# Patient Record
Sex: Female | Born: 2005 | Race: Black or African American | Hispanic: No | Marital: Single | Smoking: Never smoker
Health system: Southern US, Community
[De-identification: ages and names within clinical notes are randomized; demographics above are authoritative.]

## PROBLEM LIST (undated history)

## (undated) ENCOUNTER — Ambulatory Visit (HOSPITAL_COMMUNITY)

## (undated) DIAGNOSIS — T7432XA Child psychological abuse, confirmed, initial encounter: Secondary | ICD-10-CM

## (undated) DIAGNOSIS — L2089 Other atopic dermatitis: Secondary | ICD-10-CM

## (undated) DIAGNOSIS — T7412XA Child physical abuse, confirmed, initial encounter: Secondary | ICD-10-CM

## (undated) DIAGNOSIS — Z559 Problems related to education and literacy, unspecified: Secondary | ICD-10-CM

## (undated) DIAGNOSIS — J189 Pneumonia, unspecified organism: Secondary | ICD-10-CM

## (undated) DIAGNOSIS — J4520 Mild intermittent asthma, uncomplicated: Secondary | ICD-10-CM

## (undated) DIAGNOSIS — J309 Allergic rhinitis, unspecified: Secondary | ICD-10-CM

## (undated) DIAGNOSIS — R6250 Unspecified lack of expected normal physiological development in childhood: Secondary | ICD-10-CM

## (undated) DIAGNOSIS — L851 Acquired keratosis [keratoderma] palmaris et plantaris: Secondary | ICD-10-CM

## (undated) DIAGNOSIS — S62617A Displaced fracture of proximal phalanx of left little finger, initial encounter for closed fracture: Secondary | ICD-10-CM

## (undated) HISTORY — DX: Mild intermittent asthma, uncomplicated: J45.20

## (undated) HISTORY — PX: OTHER SURGICAL HISTORY: SHX169

## (undated) HISTORY — DX: Child psychological abuse, confirmed, initial encounter: T74.32XA

## (undated) HISTORY — DX: Acquired keratosis (keratoderma) palmaris et plantaris: L85.1

## (undated) HISTORY — DX: Displaced fracture of proximal phalanx of left little finger, initial encounter for closed fracture: S62.617A

## (undated) HISTORY — DX: Child physical abuse, confirmed, initial encounter: T74.12XA

## (undated) HISTORY — DX: Problems related to education and literacy, unspecified: Z55.9

## (undated) HISTORY — DX: Other atopic dermatitis: L20.89

## (undated) HISTORY — DX: Pneumonia, unspecified organism: J18.9

## (undated) HISTORY — DX: Unspecified lack of expected normal physiological development in childhood: R62.50

## (undated) HISTORY — DX: Allergic rhinitis, unspecified: J30.9

---
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
## 2024-05-10 ENCOUNTER — Encounter (HOSPITAL_COMMUNITY): Payer: Self-pay

## 2024-05-10 ENCOUNTER — Ambulatory Visit (HOSPITAL_COMMUNITY)
Admission: EM | Admit: 2024-05-10 | Discharge: 2024-05-10 | Disposition: A | Discharge status: Home / Self Care | Attending: Nurse Practitioner | Admitting: Nurse Practitioner

## 2024-05-10 ENCOUNTER — Ambulatory Visit (HOSPITAL_COMMUNITY): Payer: Self-pay | Admitting: Nurse Practitioner

## 2024-05-10 ENCOUNTER — Other Ambulatory Visit: Payer: Self-pay

## 2024-05-10 ENCOUNTER — Emergency Department (HOSPITAL_COMMUNITY)
Admission: EM | Admit: 2024-05-10 | Discharge: 2024-05-11 | Disposition: A | Discharge status: Home / Self Care | Attending: Emergency Medicine | Admitting: Emergency Medicine

## 2024-05-10 VITALS — BP 106/70 | HR 80 | Temp 99.1°F | Resp 15

## 2024-05-10 DIAGNOSIS — R55 Syncope and collapse: Secondary | ICD-10-CM | POA: Insufficient documentation

## 2024-05-10 DIAGNOSIS — E860 Dehydration: Secondary | ICD-10-CM | POA: Diagnosis not present

## 2024-05-10 DIAGNOSIS — D649 Anemia, unspecified: Secondary | ICD-10-CM | POA: Diagnosis not present

## 2024-05-10 DIAGNOSIS — Z3202 Encounter for pregnancy test, result negative: Secondary | ICD-10-CM | POA: Diagnosis not present

## 2024-05-10 DIAGNOSIS — R42 Dizziness and giddiness: Secondary | ICD-10-CM | POA: Diagnosis present

## 2024-05-10 LAB — CBC WITH DIFFERENTIAL/PLATELET
Abs Immature Granulocytes: 0.02 K/uL (ref 0.00–0.07)
Abs Immature Granulocytes: 0.02 K/uL (ref 0.00–0.07)
Basophils Absolute: 0.1 K/uL (ref 0.0–0.1)
Basophils Absolute: 0.1 K/uL (ref 0.0–0.1)
Basophils Relative: 1 %
Basophils Relative: 1 %
Eosinophils Absolute: 0.1 K/uL (ref 0.0–0.5)
Eosinophils Absolute: 0.1 K/uL (ref 0.0–0.5)
Eosinophils Relative: 1 %
Eosinophils Relative: 1 %
HCT: 25.1 % — ABNORMAL LOW (ref 36.0–46.0)
HCT: 24 % — ABNORMAL LOW (ref 36.0–46.0)
Hemoglobin: 7.4 g/dL — ABNORMAL LOW (ref 12.0–15.0)
Hemoglobin: 7 g/dL — ABNORMAL LOW (ref 12.0–15.0)
Immature Granulocytes: 0 %
Immature Granulocytes: 0 %
Lymphocytes Relative: 36 %
Lymphocytes Relative: 41 %
Lymphs Abs: 2.4 K/uL (ref 0.7–4.0)
Lymphs Abs: 2.8 K/uL (ref 0.7–4.0)
MCH: 20.2 pg — ABNORMAL LOW (ref 26.0–34.0)
MCH: 20.3 pg — ABNORMAL LOW (ref 26.0–34.0)
MCHC: 29.5 g/dL — ABNORMAL LOW (ref 30.0–36.0)
MCHC: 29.2 g/dL — ABNORMAL LOW (ref 30.0–36.0)
MCV: 68.6 fL — ABNORMAL LOW (ref 80.0–100.0)
MCV: 69.8 fL — ABNORMAL LOW (ref 80.0–100.0)
Monocytes Absolute: 0.4 K/uL (ref 0.1–1.0)
Monocytes Absolute: 0.5 K/uL (ref 0.1–1.0)
Monocytes Relative: 6 %
Monocytes Relative: 8 %
Neutro Abs: 3.8 K/uL (ref 1.7–7.7)
Neutro Abs: 3.4 K/uL (ref 1.7–7.7)
Neutrophils Relative %: 56 %
Neutrophils Relative %: 49 %
Platelets: 260 K/uL (ref 150–400)
Platelets: 244 K/uL (ref 150–400)
RBC: 3.66 MIL/uL — ABNORMAL LOW (ref 3.87–5.11)
RBC: 3.44 MIL/uL — ABNORMAL LOW (ref 3.87–5.11)
RDW: 21.9 % — ABNORMAL HIGH (ref 11.5–15.5)
RDW: 22.1 % — ABNORMAL HIGH (ref 11.5–15.5)
Smear Review: NORMAL
WBC: 6.8 K/uL (ref 4.0–10.5)
WBC: 6.8 K/uL (ref 4.0–10.5)
nRBC: 0 % (ref 0.0–0.2)
nRBC: 0 % (ref 0.0–0.2)

## 2024-05-10 LAB — TSH: TSH: 0.931 u[IU]/mL (ref 0.350–4.500)

## 2024-05-10 LAB — I-STAT CHEM 8, ED
BUN: 13 mg/dL (ref 6–20)
Calcium, Ion: 1.23 mmol/L (ref 1.15–1.40)
Chloride: 108 mmol/L (ref 98–111)
Creatinine, Ser: 0.8 mg/dL (ref 0.44–1.00)
Glucose, Bld: 100 mg/dL — ABNORMAL HIGH (ref 70–99)
HCT: 25 % — ABNORMAL LOW (ref 36.0–46.0)
Hemoglobin: 8.5 g/dL — ABNORMAL LOW (ref 12.0–15.0)
Potassium: 3.6 mmol/L (ref 3.5–5.1)
Sodium: 140 mmol/L (ref 135–145)
TCO2: 23 mmol/L (ref 22–32)

## 2024-05-10 LAB — COMPREHENSIVE METABOLIC PANEL WITH GFR
ALT: 14 U/L (ref 0–44)
AST: 32 U/L (ref 15–41)
Albumin: 4.1 g/dL (ref 3.5–5.0)
Alkaline Phosphatase: 56 U/L (ref 38–126)
Anion gap: 14 (ref 5–15)
BUN: 14 mg/dL (ref 6–20)
CO2: 21 mmol/L — ABNORMAL LOW (ref 22–32)
Calcium: 9.8 mg/dL (ref 8.9–10.3)
Chloride: 103 mmol/L (ref 98–111)
Creatinine, Ser: 0.84 mg/dL (ref 0.44–1.00)
GFR, Estimated: >60 mL/min (ref >60)
Glucose, Bld: 84 mg/dL (ref 70–99)
Potassium: 3.6 mmol/L (ref 3.5–5.1)
Sodium: 138 mmol/L (ref 135–145)
Total Bilirubin: 0.9 mg/dL (ref 0.0–1.2)
Total Protein: 7.8 g/dL (ref 6.5–8.1)

## 2024-05-10 LAB — POCT URINE DIPSTICK
Bilirubin, UA: NEGATIVE
Glucose, UA: NEGATIVE mg/dL
Leukocytes, UA: NEGATIVE
Nitrite, UA: NEGATIVE
POC PROTEIN,UA: 30 — AB
Spec Grav, UA: 1.025 (ref 1.010–1.025)
Urobilinogen, UA: 0.2 U/dL
pH, UA: 6 (ref 5.0–8.0)

## 2024-05-10 LAB — PREPARE RBC (CROSSMATCH)

## 2024-05-10 LAB — POCT URINE PREGNANCY: Preg Test, Ur: NEGATIVE

## 2024-05-10 LAB — GLUCOSE, POCT (MANUAL RESULT ENTRY): POCT Glucose (KUC): 84 mg/dL (ref 70–99)

## 2024-05-10 LAB — ABO/RH: ABO/RH(D): O POS

## 2024-05-10 MED ORDER — SODIUM CHLORIDE 0.9 % IV BOLUS
1000.0000 mL | Freq: Once | INTRAVENOUS | Status: AC
  Administered 2024-05-10: 1000 mL via INTRAVENOUS

## 2024-05-10 MED ORDER — FERROUS SULFATE 325 (65 FE) MG PO TABS: 325.0000 mg | ORAL_TABLET | Freq: Every day | ORAL | 0 refills | Status: DC

## 2024-05-10 MED ORDER — SODIUM CHLORIDE 0.9% IV SOLUTION: Freq: Once | INTRAVENOUS | Status: AC

## 2024-05-10 NOTE — ED Provider Notes (Signed)
 [place] EMERGENCY DEPARTMENT AT [facility] Provider Note   CSN: [id number] Arrival date & time: 05/10/24  2005     Patient presents with: abnormal labs   [name] is a 18 y.o. female presenting with anemia.  Patient states that she went to give blood 4 days ago.  Patient states that her hemoglobin was 13 at that time.  She states that since then she has been having vaginal bleeding.  She states that she stopped bleeding 2 days ago.  She states that 2 days ago she had a syncopal episode.  Patient also has been feeling lightheaded dizzy.  Went to urgent care and hemoglobin was 7.4.  Patient adamantly denies any rectal bleeding.  Denies any chest pain.   The history is provided by the patient.       Prior to Admission medications   Not on File    Allergies: Patient has no known allergies.    Review of Systems  Psychiatric/Behavioral:  Positive for confusion.   All other systems reviewed and are negative.   Updated Vital Signs BP 123/83 (BP Location: Left Arm)   Pulse 93   Temp 98.5 F (36.9 C) (Oral)   Resp 18   Ht 5' 2 (1.575 m)   Wt 54.4 kg   LMP 05/03/2024 (Exact Date)   SpO2 100%   BMI 21.94 kg/m   Physical Exam Vitals and nursing note reviewed.  Constitutional:      Appearance: Normal appearance.     Comments: Slightly pale   HENT:     Head: Normocephalic.     Nose: Nose normal.     Mouth/Throat:     Mouth: Mucous membranes are moist.  Eyes:     Extraocular Movements: Extraocular movements intact.     Pupils: Pupils are equal, round, and reactive to light.  Cardiovascular:     Rate and Rhythm: Normal rate and regular rhythm.     Pulses: Normal pulses.     Heart sounds: Normal heart sounds.  Pulmonary:     Effort: Pulmonary effort is normal.     Breath sounds: Normal breath sounds.  Abdominal:     General: Abdomen is flat.     Palpations: Abdomen is soft.  Musculoskeletal:        General: Normal range of motion.     Cervical  back: Normal range of motion and neck supple.  Skin:    General: Skin is warm.     Capillary Refill: Capillary refill takes less than 2 seconds.  Neurological:     General: No focal deficit present.     Mental Status: She is alert and oriented to person, place, and time.  Psychiatric:        Mood and Affect: Mood normal.        Behavior: Behavior normal.     (all labs ordered are listed, but only abnormal results are displayed) Labs Reviewed  I-STAT CHEM 8, ED - Abnormal; Notable for the following components:      Result Value   Glucose, Bld 100 (*)    Hemoglobin 8.5 (*)    HCT 25.0 (*)    All other components within normal limits  CBC WITH DIFFERENTIAL/PLATELET  TYPE AND SCREEN  PREPARE RBC (CROSSMATCH)    EKG: None  Radiology: No results found.   Procedures   Medications Ordered in the ED  sodium chloride  0.9 % bolus 1,000 mL (has no administration in time range)  0.9 %  sodium chloride   infusion (Manually program via Guardrails IV Fluids) (has no administration in time range)                                    Medical Decision Making [name] is a 18 y.o. female here with dizziness, near syncope. Patient had blood donation and now has vaginal bleeding and likely has symptomatic anemia from blood loss. Will check CBC and likely need transfusion for symptomatic anemia.    10:52 PM Hemoglobin is 7.  1 unit PRBC ordered.  11:06 PM Signed out to Dr. Emil. Anticipate dc home after she receives 1 U PRBC   Amount and/or Complexity of Data Reviewed Labs: ordered.  Risk Prescription drug management.     Final diagnoses:  None    ED Discharge Orders     None          Stephanie Alm Macho, MD 05/10/24 2307

## 2024-05-10 NOTE — ED Triage Notes (Addendum)
 Pt reports from UC with low hemoglobin. Pt currently denying any symptoms but recently had a syncopal episode.

## 2024-05-10 NOTE — Discharge Instructions (Addendum)
 You were seen today after experiencing a fainting episode at school a couple of days ago. Before you passed out, you felt dizzy and hot, and after regaining consciousness, you felt confused and sweaty. There were no signs of a seizure, incontinence, or trauma to your mouth, which is reassuring. We did a few tests, and your vital signs were normal. Your urine showed some ketones, which could be a sign of dehydration, but there were no signs of infection. Your blood sugar levels were normal.  To help with the dehydration, we gave you IV fluids today, and we've ordered some blood tests to check for other possible causes, including your kidney and thyroid function. You should drink plenty of fluids and avoid getting dehydrated in the future. Make sure to stay hydrated, especially in warm environments or after physical activity.  Please follow up with your primary care doctor to review these results and discuss any further care. If you experience any of the following symptoms, go to the emergency room immediately: worsening dizziness, fainting again, chest pain, trouble breathing, severe headaches, or any new symptoms that concern you.

## 2024-05-10 NOTE — Telephone Encounter (Signed)
 The patient's hemoglobin returned at 7.4, with white blood cells and platelets within normal limits, confirmed by peripheral smear. I contacted the patient, who had already been discharged from the clinic. She denies any recent history of dark or bloody stools and has no history of bleeding disorders. The patient does not take aspirin or any blood thinners, and she has no history of sickle cell disease.  The patient reports donating blood on 05/06/2024, two days prior to her syncopal episode. She states that her hemoglobin prior to the donation was 13. She denies any previous history of anemia. Based on these findings, the patient was advised to go to the emergency room immediately for further evaluation and potential blood transfusion. The patient agreed and will be going to the [facility] emergency department.  Notably, the lab suggested that a reticulocyte hemoglobin level be considered, and this was noted with lab number [id number]. The ED provider staff has been informed of the patient's pending arrival.

## 2024-05-10 NOTE — ED Triage Notes (Signed)
 Pt reports that Friday was in lunch line at school and got and passed out. Reports her mother told her not to go with EMS to be seen. Pt is wanting to be checked out now.

## 2024-05-10 NOTE — ED Notes (Signed)
 Blood administration began.  Pt resting in bed with grandmother at bedside. Pt remains on continuous bp and o2 monitoring.  No respiratory distress noted.  Blood gtt began at 67ml/hr during initial 15 mins while this RN remains at bedside.  PT re-educated on the signs of possible blood transfusion reaction.
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
## 2024-05-10 NOTE — ED Notes (Signed)
 First poc with patient. Pt resting in bed with grandmother at bedside. PT states she donated blood on Wednesday and on Friday had a syncopal episode on the way to class. Denies head strike.  Pt states she was also on her menstrual cycle x5 days which is normal for her.  Denies any observation of blood in urine or stool.  PT axox4. GCS 15. Denies pain. No respiratory distress noted.

## 2024-05-10 NOTE — ED Notes (Signed)
 Remained with patient for 15 mins after initiation of blood transfusion.  Patient denies any symptoms, resting in bed quietly.  No respiratory distress noted.  Increased rate of blood transfusion from 75/ml per hour to 125/ml per hr.  PT directed to notify RN of any symptoms or discomfort.  Provided warm blankets to both patient and grandmother of patient

## 2024-05-10 NOTE — ED Provider Notes (Signed)
 Received patient in turnover from Dr. Patt.  Please see their note for further details of Hx, PE.  Briefly patient is a 18 y.o. female with a abnormal labs .  Heavy menses and blood donation.  Hgb 7.  Plan for transfuse and home.  Blood transfusion completed.  Will discharge home as per plan    Emil Share, DO 05/11/24 0145

## 2024-05-10 NOTE — Discharge Instructions (Signed)
 You are anemic likely from donating blood and vaginal bleeding. I ordered iron supplement   Repeat CBC in a month   See your doctor for follow up   Return to ER if you have dizziness, passing out

## 2024-05-11 LAB — TYPE AND SCREEN
ABO/RH(D): O POS
Antibody Screen: NEGATIVE
Unit division: 0

## 2024-05-11 LAB — BPAM RBC
Blood Product Expiration Date: 202512262359
ISSUE DATE / TIME: 202511232310
Unit Type and Rh: 5100

## 2024-05-11 NOTE — ED Notes (Signed)
 PT axox4. GCS 15. Pt verbalizes understanding of discharge instructions, follow up and new Rx. Pt ambulated out of er with steady gait to transportation home with grandmother

## 2024-06-03 ENCOUNTER — Encounter: Payer: Self-pay | Admitting: Family Medicine

## 2024-06-03 ENCOUNTER — Ambulatory Visit: Payer: Self-pay | Admitting: Family Medicine

## 2024-06-03 VITALS — BP 97/60 | HR 85 | Ht 62.0 in | Wt 123.1 lb

## 2024-06-03 DIAGNOSIS — Z1159 Encounter for screening for other viral diseases: Secondary | ICD-10-CM

## 2024-06-03 DIAGNOSIS — D509 Iron deficiency anemia, unspecified: Secondary | ICD-10-CM

## 2024-06-03 DIAGNOSIS — D649 Anemia, unspecified: Secondary | ICD-10-CM | POA: Diagnosis not present

## 2024-06-03 DIAGNOSIS — Z23 Encounter for immunization: Secondary | ICD-10-CM

## 2024-06-03 DIAGNOSIS — Z114 Encounter for screening for human immunodeficiency virus [HIV]: Secondary | ICD-10-CM | POA: Diagnosis not present

## 2024-06-03 MED ORDER — FERROUS SULFATE 325 (65 FE) MG PO TABS: 325.0000 mg | ORAL_TABLET | Freq: Every day | ORAL | 0 refills | Status: AC

## 2024-06-03 NOTE — Progress Notes (Signed)
° ° °  SUBJECTIVE:   CHIEF COMPLAINT / HPI:   ED f/u Was seen in the ED 11/23 for acute anemia in setting of heavy menses and blood donation. Required 1u PRBC then was discharged  Discussed the use of AI scribe software for clinical note transcription with the patient, who gave verbal consent to proceed.  History of Present Illness [name] is a 18 year old female who presents for follow-up after a recent blood transfusion due to heavy menstrual bleeding and low hemoglobin levels.  Menstrual bleeding - Recent episode of heavy menstrual bleeding following blood donation - No menstrual period or vaginal bleeding since receiving blood transfusion  Anemia and blood transfusion - Low hemoglobin detected during emergency room visit - Received blood transfusion for anemia - History of mildly low hemoglobin - Currently taking iron supplement and paracetamol 325 mg daily  Associated symptoms - No lightheadedness, dizziness, palpitations, or racing heart since transfusion     PERTINENT  PMH / PSH: anemia   OBJECTIVE:   BP 97/60   Pulse 85   Ht 5' 2 (1.575 m)   Wt 123 lb 2 oz (55.8 kg)   LMP 05/03/2024 (Exact Date)   SpO2 100%   BMI 22.52 kg/m    General: NAD, pleasant, able to participate in exam Cardiac: RRR, no murmurs auscultated Respiratory: CTAB, normal WOB Abdomen: soft, non-tender, non-distended, normoactive bowel sounds Extremities: warm and well perfused, no edema or cyanosis Skin: warm and dry, no rashes noted Neuro: alert, no obvious focal deficits, speech normal Psych: Normal affect and mood  ASSESSMENT/PLAN:     Assessment & Plan Anemia, unspecified type Recent blood transfusion for heavy menstrual bleeding and donation. Asymptomatic on iron supplementation. - Ordered CBC to assess hemoglobin. - Ordered iron studies to evaluate iron status. - Continue daily iron supplementation (325 mg). Did not take today yet - Advised to seek medical attention if  palpitations, chest pain, shortness of breath, dizziness, or lightheadedness occur.  Health maintenance -flu shot today -HIV, Hep C screening due, ordered  Payton Coward, MD [facility]

## 2024-06-03 NOTE — Patient Instructions (Signed)
°  VISIT SUMMARY: You had a follow-up visit after your recent blood transfusion due to heavy menstrual bleeding and low hemoglobin levels. You are currently taking iron supplements and have not experienced any symptoms since the transfusion.  YOUR PLAN: IRON DEFICIENCY ANEMIA: You had a recent blood transfusion due to heavy menstrual bleeding and low hemoglobin levels. You are currently asymptomatic and taking iron supplements. -A CBC test has been ordered to assess your hemoglobin levels. -Iron studies have been ordered to evaluate your iron status. -Continue taking your daily iron supplement (325 mg). -Seek medical attention if you experience palpitations, chest pain, shortness of breath, dizziness, or lightheadedness.  GENERAL HEALTH MAINTENANCE: You received a flu shot during this visit. -Continue with your routine health maintenance.                      Contains text generated by [employer].                                 Contains text generated by [employer].

## 2024-06-04 ENCOUNTER — Ambulatory Visit: Payer: Self-pay | Admitting: Family Medicine

## 2024-06-04 LAB — CBC WITH DIFFERENTIAL/PLATELET
Basophils Absolute: 0.1 x10E3/uL (ref 0.0–0.2)
Basos: 1 %
EOS (ABSOLUTE): 0.2 x10E3/uL (ref 0.0–0.4)
Eos: 3 %
Hematocrit: 40.8 % (ref 34.0–46.6)
Hemoglobin: 12.5 g/dL (ref 11.1–15.9)
Immature Grans (Abs): 0 x10E3/uL (ref 0.0–0.1)
Immature Granulocytes: 0 %
Lymphocytes Absolute: 2.2 x10E3/uL (ref 0.7–3.1)
Lymphs: 31 %
MCH: 24.6 pg — ABNORMAL LOW (ref 26.6–33.0)
MCHC: 30.6 g/dL — ABNORMAL LOW (ref 31.5–35.7)
MCV: 80 fL (ref 79–97)
Monocytes Absolute: 0.4 x10E3/uL (ref 0.1–0.9)
Monocytes: 6 %
Neutrophils Absolute: 4.3 x10E3/uL (ref 1.4–7.0)
Neutrophils: 59 %
Platelets: 350 x10E3/uL (ref 150–450)
RBC: 5.09 x10E6/uL (ref 3.77–5.28)
RDW: 25.6 % — ABNORMAL HIGH (ref 11.7–15.4)
WBC: 7.1 x10E3/uL (ref 3.4–10.8)

## 2024-06-04 LAB — HCV INTERPRETATION

## 2024-06-04 LAB — IRON,TIBC AND FERRITIN PANEL
Ferritin: 44 ng/mL (ref 15–77)
Iron Saturation: 20 % (ref 15–55)
Iron: 82 ug/dL (ref 27–159)
Total Iron Binding Capacity: 401 ug/dL (ref 250–450)
UIBC: 319 ug/dL (ref 131–425)

## 2024-06-04 LAB — HIV ANTIBODY (ROUTINE TESTING W REFLEX): HIV Screen 4th Generation wRfx: NONREACTIVE

## 2024-06-04 LAB — HCV AB W REFLEX TO QUANT PCR: HCV Ab: NONREACTIVE
